# Patient Record
Sex: Female | Born: 1985 | Race: White | Hispanic: No | Marital: Single | Smoking: Never smoker
Health system: Southern US, Community
[De-identification: ages and names within clinical notes are randomized; demographics above are authoritative.]

## PROBLEM LIST (undated history)

## (undated) DIAGNOSIS — J45909 Unspecified asthma, uncomplicated: Secondary | ICD-10-CM

## (undated) HISTORY — PX: LEEP: SHX91

---
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
## 2013-04-15 ENCOUNTER — Emergency Department (HOSPITAL_COMMUNITY)
Admission: EM | Admit: 2013-04-15 | Discharge: 2013-04-15 | Disposition: A | Discharge status: Home / Self Care | Payer: BC Managed Care – PPO | Attending: Emergency Medicine | Admitting: Emergency Medicine

## 2013-04-15 ENCOUNTER — Encounter (HOSPITAL_COMMUNITY): Payer: Self-pay | Admitting: Emergency Medicine

## 2013-04-15 DIAGNOSIS — Z3202 Encounter for pregnancy test, result negative: Secondary | ICD-10-CM | POA: Insufficient documentation

## 2013-04-15 DIAGNOSIS — R55 Syncope and collapse: Secondary | ICD-10-CM

## 2013-04-15 DIAGNOSIS — M545 Low back pain, unspecified: Secondary | ICD-10-CM | POA: Insufficient documentation

## 2013-04-15 DIAGNOSIS — J45909 Unspecified asthma, uncomplicated: Secondary | ICD-10-CM | POA: Insufficient documentation

## 2013-04-15 DIAGNOSIS — IMO0002 Reserved for concepts with insufficient information to code with codable children: Secondary | ICD-10-CM | POA: Insufficient documentation

## 2013-04-15 DIAGNOSIS — Z79899 Other long term (current) drug therapy: Secondary | ICD-10-CM | POA: Insufficient documentation

## 2013-04-15 DIAGNOSIS — R197 Diarrhea, unspecified: Secondary | ICD-10-CM | POA: Insufficient documentation

## 2013-04-15 HISTORY — DX: Unspecified asthma, uncomplicated: J45.909

## 2013-04-15 LAB — COMPREHENSIVE METABOLIC PANEL
ALT: 15 U/L (ref 0–35)
AST: 19 U/L (ref 0–37)
Albumin: 3.8 g/dL (ref 3.5–5.2)
Alkaline Phosphatase: 57 U/L (ref 39–117)
BILIRUBIN TOTAL: 0.3 mg/dL (ref 0.3–1.2)
BUN: 17 mg/dL (ref 6–23)
CO2: 26 mEq/L (ref 19–32)
Calcium: 9.3 mg/dL (ref 8.4–10.5)
Chloride: 101 mEq/L (ref 96–112)
Creatinine, Ser: 0.94 mg/dL (ref 0.50–1.10)
GFR calc non Af Amer: 82 mL/min — ABNORMAL LOW (ref >90)
Glucose, Bld: 100 mg/dL — ABNORMAL HIGH (ref 70–99)
Potassium: 4.1 mEq/L (ref 3.7–5.3)
Sodium: 139 mEq/L (ref 137–147)
TOTAL PROTEIN: 7.2 g/dL (ref 6.0–8.3)

## 2013-04-15 LAB — URINALYSIS, ROUTINE W REFLEX MICROSCOPIC
BILIRUBIN URINE: NEGATIVE
Glucose, UA: NEGATIVE mg/dL
Hgb urine dipstick: NEGATIVE
KETONES UR: NEGATIVE mg/dL
NITRITE: NEGATIVE
PH: 6.5 (ref 5.0–8.0)
PROTEIN: NEGATIVE mg/dL
Specific Gravity, Urine: 1.021 (ref 1.005–1.030)
UROBILINOGEN UA: 0.2 mg/dL (ref 0.0–1.0)

## 2013-04-15 LAB — URINE MICROSCOPIC-ADD ON

## 2013-04-15 LAB — CBC WITH DIFFERENTIAL/PLATELET
Basophils Absolute: 0 10*3/uL (ref 0.0–0.1)
Basophils Relative: 0 % (ref 0–1)
EOS ABS: 0.2 10*3/uL (ref 0.0–0.7)
Eosinophils Relative: 2 % (ref 0–5)
HCT: 38.9 % (ref 36.0–46.0)
HEMOGLOBIN: 13.4 g/dL (ref 12.0–15.0)
LYMPHS PCT: 27 % (ref 12–46)
Lymphs Abs: 2.4 10*3/uL (ref 0.7–4.0)
MCH: 30 pg (ref 26.0–34.0)
MCHC: 34.4 g/dL (ref 30.0–36.0)
MCV: 87.2 fL (ref 78.0–100.0)
MONOS PCT: 7 % (ref 3–12)
Monocytes Absolute: 0.6 10*3/uL (ref 0.1–1.0)
Neutro Abs: 5.7 10*3/uL (ref 1.7–7.7)
Neutrophils Relative %: 63 % (ref 43–77)
PLATELETS: 283 10*3/uL (ref 150–400)
RBC: 4.46 MIL/uL (ref 3.87–5.11)
RDW: 12.7 % (ref 11.5–15.5)
WBC: 9 10*3/uL (ref 4.0–10.5)

## 2013-04-15 LAB — TROPONIN I: Troponin I: <0.3 ng/mL (ref <0.30)

## 2013-04-15 LAB — PREGNANCY, URINE: Preg Test, Ur: NEGATIVE

## 2013-04-15 MED ORDER — SODIUM CHLORIDE 0.9 % IV SOLN
INTRAVENOUS | Status: DC
  Administered 2013-04-15: 10:00:00 via INTRAVENOUS

## 2013-04-15 MED ORDER — SODIUM CHLORIDE 0.9 % IV BOLUS (SEPSIS): 1000.0000 mL | Freq: Once | INTRAVENOUS | Status: DC

## 2013-04-15 NOTE — Discharge Instructions (Signed)
Continue to drink a lot of fluids. Avoid milk until the diarrhea is gone. If you continue to have diarrhea take imodium OTC. Return if you get worse such as fever, uncontrolled diarrhea or you start vomiting also.    Diarrhea Diarrhea is frequent loose and watery bowel movements. It can cause you to feel weak and dehydrated. Dehydration can cause you to become tired and thirsty, have a dry mouth, and have decreased urination that often is dark yellow. Diarrhea is a sign of another problem, most often an infection that will not last long. In most cases, diarrhea typically lasts 2 3 days. However, it can last longer if it is a sign of something more serious. It is important to treat your diarrhea as directed by your caregive to lessen or prevent future episodes of diarrhea. CAUSES  Some common causes include:  Gastrointestinal infections caused by viruses, bacteria, or parasites.  Food poisoning or food allergies.  Certain medicines, such as antibiotics, chemotherapy, and laxatives.  Artificial sweeteners and fructose.  Digestive disorders. HOME CARE INSTRUCTIONS  Ensure adequate fluid intake (hydration): have 1 cup (8 oz) of fluid for each diarrhea episode. Avoid fluids that contain simple sugars or sports drinks, fruit juices, whole milk products, and sodas. Your urine should be clear or pale yellow if you are drinking enough fluids. Hydrate with an oral rehydration solution that you can purchase at pharmacies, retail stores, and online. You can prepare an oral rehydration solution at home by mixing the following ingredients together:    tsp table salt.   tsp baking soda.   tsp salt substitute containing potassium chloride.  1  tablespoons sugar.  1 L (34 oz) of water.  Certain foods and beverages may increase the speed at which food moves through the gastrointestinal (GI) tract. These foods and beverages should be avoided and include:  Caffeinated and alcoholic  beverages.  High-fiber foods, such as raw fruits and vegetables, nuts, seeds, and whole grain breads and cereals.  Foods and beverages sweetened with sugar alcohols, such as xylitol, sorbitol, and mannitol.  Some foods may be well tolerated and may help thicken stool including:  Starchy foods, such as rice, toast, pasta, low-sugar cereal, oatmeal, grits, baked potatoes, crackers, and bagels.  Bananas.  Applesauce.  Add probiotic-rich foods to help increase healthy bacteria in the GI tract, such as yogurt and fermented milk products.  Wash your hands well after each diarrhea episode.  Only take over-the-counter or prescription medicines as directed by your caregiver.  Take a warm bath to relieve any burning or pain from frequent diarrhea episodes. SEEK IMMEDIATE MEDICAL CARE IF:   You are unable to keep fluids down.  You have persistent vomiting.  You have blood in your stool, or your stools are black and tarry.  You do not urinate in 6 8 hours, or there is only a small amount of very dark urine.  You have abdominal pain that increases or localizes.  You have weakness, dizziness, confusion, or lightheadedness.  You have a severe headache.  Your diarrhea gets worse or does not get better.  You have a fever or persistent symptoms for more than 2 3 days.  You have a fever and your symptoms suddenly get worse. MAKE SURE YOU:   Understand these instructions.  Will watch your condition.  Will get help right away if you are not doing well or get worse. Document Released: 01/18/2002 Document Revised: 01/15/2012 Document Reviewed: 10/06/2011 [facility] Patient Information 2014 [employer], [place].

## 2013-04-15 NOTE — ED Notes (Signed)
Water given to patient 

## 2013-04-15 NOTE — ED Provider Notes (Signed)
CSN: [id number]     Arrival date & time 04/15/13  0850 History   First MD Initiated Contact with Patient 04/15/13 [phone]     Chief Complaint  Patient presents with  . Diarrhea  . Loss of Consciousness     (Consider location/radiation/quality/duration/timing/severity/associated sxs/prior Treatment) HPI Patient reports she's had some lower back discomfort for the past 3 days. She is taking no medications for it. She states she felt fine yesterday except for her back pain. She states this morning she woke up and felt the need to have a bowel movement. She states when she went to the bathroom her stool was very loose. She started feeling like she was going to pass out and got hot and nauseated and did pass out. She states her roommates heard her fall. She's unsure how long she was passed out but she could hear them talking about calling 911. She denies any injury from the fall or syncope today. She is unsure if she had rectal bleeding.  She states she feels better now however she still has some weakness without lightheadedness. She states she did have some diffuse abdominal pain this morning prior to having the diarrhea, she states it felt like she had to pass gas but she didn't. She denies nausea currently.  Patient reports she passed out and 2010 when her sister had hemorrhaging after delivery of her baby. She also passed out in 2005 after taking a lifeguard test. She reports she went back to the dressing room and then passed out.  PCP in [place]  Past Medical History  Diagnosis Date  . Asthma    Past Surgical History  Procedure Laterality Date  . Dilation and curettage of uterus  2007   History reviewed. No pertinent family history. History  Substance Use Topics  . Smoking status: Never Smoker   . Smokeless tobacco: Never Used  . Alcohol Use: No  occasional ETOH, none recently Employed Recently moved from [place]  OB History   Grav Para Term Preterm Abortions TAB SAB Ect Mult Living               Review of Systems  All other systems reviewed and are negative.      Allergies  Review of patient's allergies indicates no known allergies.  Home Medications   Current Outpatient Rx  Name  Route  Sig  Dispense  Refill  . albuterol (PROVENTIL HFA;VENTOLIN HFA) 108 (90 BASE) MCG/ACT inhaler   Inhalation   Inhale 2 puffs into the lungs every 4 (four) hours as needed for wheezing or shortness of breath.         . beclomethasone (QVAR) 80 MCG/ACT inhaler   Inhalation   Inhale 2 puffs into the lungs daily.           ED Triage Vitals  Enc Vitals Group     BP 04/15/13 0853 103/58 mmHg     Pulse Rate 04/15/13 0853 68     Resp 04/15/13 0853 16     Temp 04/15/13 0853 97.8 F (36.6 C)     Temp src 04/15/13 0853 Oral     SpO2 04/15/13 0853 100 %     Weight --      Height --      Head Cir --      Peak Flow --      Pain Score 04/15/13 1122 1     Pain Loc --      Pain Edu? --  Excl. in GC? --    Vital signs normal   Orthostatic vital signs normal  Physical Exam  Nursing note and vitals reviewed. Constitutional: She is oriented to person, place, and time. She appears well-developed and well-nourished.  Non-toxic appearance. She does not appear ill. No distress.  HENT:  Head: Normocephalic and atraumatic.  Right Ear: External ear normal.  Left Ear: External ear normal.  Nose: Nose normal. No mucosal edema or rhinorrhea.  Mouth/Throat: Oropharynx is clear and moist and mucous membranes are normal. No dental abscesses or uvula swelling.  Eyes: Conjunctivae and EOM are normal. Pupils are equal, round, and reactive to light.  Neck: Normal range of motion and full passive range of motion without pain. Neck supple.  Cardiovascular: Normal rate, regular rhythm and normal heart sounds.  Exam reveals no gallop and no friction rub.   No murmur heard. Pulmonary/Chest: Effort normal and breath sounds normal. No respiratory distress. She has no wheezes. She has no  rhonchi. She has no rales. She exhibits no tenderness and no crepitus.  Abdominal: Soft. Normal appearance and bowel sounds are normal. She exhibits no distension. There is no tenderness. There is no rebound and no guarding.  Musculoskeletal: Normal range of motion. She exhibits no edema and no tenderness.  Moves all extremities well.   Neurological: She is alert and oriented to person, place, and time. She has normal strength. No cranial nerve deficit.  Skin: Skin is warm, dry and intact. No rash noted. No erythema. No pallor.  Psychiatric: She has a normal mood and affect. Her speech is normal and behavior is normal. Her mood appears not anxious.    ED Course  Procedures (including critical care time)  Medications  0.9 %  sodium chloride infusion ( Intravenous Stopped 04/15/13 1127)  sodium chloride 0.9 % bolus 1,000 mL (1,000 mLs Intravenous Not Given 04/15/13 1004)   10:30 AM Patient's boyfriend is here. He states patient went to the bathroom and had diarrhea and called him and said she needed something to drink. He was going down the stairs to get her a drink of water and he heard her fall off the toilet. She landed between the toilet and the tub. He states she was in a semi-prone position with her back on the wall. He states when he went to help pick her up she was clammy and she immediately woke up. He states she had LOC for 30 seconds or less.  She complained of having to have diarrhea again. He states there was no blood in the toilet. He states he ate the same food that she ate yesterday. He states he feels fine.    13:00Patient has been ambulatory to the bathroom and states she felt fine. She has been drinking fluids because her IV infiltrated. She feels ready to go home.  Labs Review Results for orders placed during the hospital encounter of 04/15/13  CBC WITH DIFFERENTIAL      Result Value Ref Range   WBC 9.0  4.0 - 10.5 K/uL   RBC 4.46  3.87 - 5.11 MIL/uL   Hemoglobin 13.4  12.0 -  15.0 g/dL   HCT 41.3  24.4 - 01.0 %   MCV 87.2  78.0 - 100.0 fL   MCH 30.0  26.0 - 34.0 pg   MCHC 34.4  30.0 - 36.0 g/dL   RDW 27.2  53.6 - 64.4 %   Platelets 283  150 - 400 K/uL   Neutrophils Relative % 63  43 -  77 %   Neutro Abs 5.7  1.7 - 7.7 K/uL   Lymphocytes Relative 27  12 - 46 %   Lymphs Abs 2.4  0.7 - 4.0 K/uL   Monocytes Relative 7  3 - 12 %   Monocytes Absolute 0.6  0.1 - 1.0 K/uL   Eosinophils Relative 2  0 - 5 %   Eosinophils Absolute 0.2  0.0 - 0.7 K/uL   Basophils Relative 0  0 - 1 %   Basophils Absolute 0.0  0.0 - 0.1 K/uL  COMPREHENSIVE METABOLIC PANEL      Result Value Ref Range   Sodium 139  137 - 147 mEq/L   Potassium 4.1  3.7 - 5.3 mEq/L   Chloride 101  96 - 112 mEq/L   CO2 26  19 - 32 mEq/L   Glucose, Bld 100 (*) 70 - 99 mg/dL   BUN 17  6 - 23 mg/dL   Creatinine, Ser 1.610.94  0.50 - 1.10 mg/dL   Calcium 9.3  8.4 - 09.610.5 mg/dL   Total Protein 7.2  6.0 - 8.3 g/dL   Albumin 3.8  3.5 - 5.2 g/dL   AST 19  0 - 37 U/L   ALT 15  0 - 35 U/L   Alkaline Phosphatase 57  39 - 117 U/L   Total Bilirubin 0.3  0.3 - 1.2 mg/dL   GFR calc non Af Amer 82 (*) >90 mL/min   GFR calc Af Amer >90  >90 mL/min  URINALYSIS, ROUTINE W REFLEX MICROSCOPIC      Result Value Ref Range   Color, Urine YELLOW  YELLOW   APPearance CLOUDY (*) CLEAR   Specific Gravity, Urine 1.021  1.005 - 1.030   pH 6.5  5.0 - 8.0   Glucose, UA NEGATIVE  NEGATIVE mg/dL   Hgb urine dipstick NEGATIVE  NEGATIVE   Bilirubin Urine NEGATIVE  NEGATIVE   Ketones, ur NEGATIVE  NEGATIVE mg/dL   Protein, ur NEGATIVE  NEGATIVE mg/dL   Urobilinogen, UA 0.2  0.0 - 1.0 mg/dL   Nitrite NEGATIVE  NEGATIVE   Leukocytes, UA MODERATE (*) NEGATIVE  PREGNANCY, URINE      Result Value Ref Range   Preg Test, Ur NEGATIVE  NEGATIVE  TROPONIN I      Result Value Ref Range   Troponin I <0.30  <0.30 ng/mL  URINE MICROSCOPIC-ADD ON      Result Value Ref Range   Squamous Epithelial / LPF FEW (*) RARE   WBC, UA 7-10  <3  WBC/hpf   RBC / HPF 0-2  <3 RBC/hpf   Bacteria, UA FEW (*) RARE    Laboratory interpretation all normal    Imaging Review No results found.   EKG Interpretation   Date/Time:  Thursday April 15 2013 08:57:34 EST Ventricular Rate:  69 PR Interval:  169 QRS Duration: 65 QT Interval:  383 QTC Calculation: 410 R Axis:   66 Text Interpretation:  Sinus rhythm Abnormal Q suggests anterior infarct  Baseline wander No old tracing to compare Confirmed by Tiffini Blacksher  MD-I, Evola Hollis  ([id number]) on 04/15/2013 10:03:01 AM      MDM  patient had syncope after her diarrhea stool, most likely vasovagal episode. She has done well in the ED and is being discharged.    Final diagnoses:  Diarrhea  Syncope    Plan discharge  Devoria AlbeIva Shaquetta Arcos, MD, Franz DellFACEP     Colon Rueth L Lexandra Rettke, MD 04/15/13 [phone]

## 2013-04-15 NOTE — ED Notes (Signed)
Pt. Is unable to use the restroom at this time, but is aware that we need a specimen. 

## 2013-04-15 NOTE — ED Notes (Signed)
Per EMS- Patient reports that she woke this AM with diarrhea and while on the toilet became diaphoretic, feeling weak, and near syncope. Patient also reports lower back pain x 3-4 days. Patient states she feels better now.

## 2014-08-24 ENCOUNTER — Other Ambulatory Visit (HOSPITAL_COMMUNITY): Payer: Self-pay | Admitting: Obstetrics and Gynecology

## 2014-08-24 DIAGNOSIS — O280 Abnormal hematological finding on antenatal screening of mother: Secondary | ICD-10-CM

## 2014-08-24 DIAGNOSIS — IMO0002 Reserved for concepts with insufficient information to code with codable children: Secondary | ICD-10-CM

## 2014-08-24 DIAGNOSIS — Z0489 Encounter for examination and observation for other specified reasons: Secondary | ICD-10-CM

## 2014-08-26 ENCOUNTER — Ambulatory Visit (HOSPITAL_COMMUNITY): Admission: RE | Admit: 2014-08-26 | Payer: No Typology Code available for payment source | Source: Ambulatory Visit

## 2014-08-26 ENCOUNTER — Other Ambulatory Visit (HOSPITAL_COMMUNITY): Payer: Self-pay | Admitting: Obstetrics and Gynecology

## 2014-08-26 ENCOUNTER — Encounter (HOSPITAL_COMMUNITY): Payer: Self-pay

## 2014-08-26 ENCOUNTER — Ambulatory Visit (HOSPITAL_COMMUNITY)
Admission: RE | Admit: 2014-08-26 | Discharge: 2014-08-26 | Disposition: A | Discharge status: Home / Self Care | Payer: No Typology Code available for payment source | Source: Ambulatory Visit | Attending: Obstetrics and Gynecology | Admitting: Obstetrics and Gynecology

## 2014-08-26 DIAGNOSIS — Z0489 Encounter for examination and observation for other specified reasons: Secondary | ICD-10-CM

## 2014-08-26 DIAGNOSIS — Z3A18 18 weeks gestation of pregnancy: Secondary | ICD-10-CM | POA: Insufficient documentation

## 2014-08-26 DIAGNOSIS — O280 Abnormal hematological finding on antenatal screening of mother: Secondary | ICD-10-CM | POA: Insufficient documentation

## 2014-08-26 DIAGNOSIS — Z36 Encounter for antenatal screening of mother: Secondary | ICD-10-CM | POA: Insufficient documentation

## 2014-08-26 DIAGNOSIS — O283 Abnormal ultrasonic finding on antenatal screening of mother: Secondary | ICD-10-CM | POA: Diagnosis present

## 2014-08-26 DIAGNOSIS — IMO0002 Reserved for concepts with insufficient information to code with codable children: Secondary | ICD-10-CM

## 2014-08-29 ENCOUNTER — Other Ambulatory Visit (HOSPITAL_COMMUNITY): Payer: Self-pay | Admitting: Obstetrics and Gynecology

## 2014-09-16 ENCOUNTER — Ambulatory Visit (HOSPITAL_COMMUNITY)
Admission: RE | Admit: 2014-09-16 | Discharge: 2014-09-16 | Disposition: A | Discharge status: Home / Self Care | Payer: No Typology Code available for payment source | Source: Ambulatory Visit | Attending: Obstetrics and Gynecology | Admitting: Obstetrics and Gynecology

## 2014-09-16 DIAGNOSIS — O280 Abnormal hematological finding on antenatal screening of mother: Secondary | ICD-10-CM

## 2014-09-16 DIAGNOSIS — O283 Abnormal ultrasonic finding on antenatal screening of mother: Secondary | ICD-10-CM | POA: Insufficient documentation

## 2014-10-02 ENCOUNTER — Inpatient Hospital Stay (HOSPITAL_COMMUNITY): Payer: No Typology Code available for payment source | Admitting: Anesthesiology

## 2014-10-02 ENCOUNTER — Encounter (HOSPITAL_COMMUNITY): Payer: Self-pay

## 2014-10-02 ENCOUNTER — Inpatient Hospital Stay (HOSPITAL_COMMUNITY)
Admission: EM | Admit: 2014-10-02 | Discharge: 2014-10-02 | Disposition: A | Discharge status: Home / Self Care | Payer: No Typology Code available for payment source | Source: Ambulatory Visit | Attending: Obstetrics and Gynecology | Admitting: Obstetrics and Gynecology

## 2014-10-02 ENCOUNTER — Inpatient Hospital Stay (HOSPITAL_COMMUNITY): Payer: No Typology Code available for payment source

## 2014-10-02 ENCOUNTER — Inpatient Hospital Stay (HOSPITAL_COMMUNITY)
Admission: AD | Admit: 2014-10-02 | Discharge: 2014-10-04 | DRG: 765 | Disposition: A | Discharge status: Home / Self Care | Payer: No Typology Code available for payment source | Source: Ambulatory Visit | Attending: Obstetrics and Gynecology | Admitting: Obstetrics and Gynecology

## 2014-10-02 ENCOUNTER — Encounter (HOSPITAL_COMMUNITY)
Admission: AD | Disposition: A | Discharge status: Home / Self Care | Payer: Self-pay | Source: Ambulatory Visit | Attending: Obstetrics and Gynecology

## 2014-10-02 DIAGNOSIS — O42112 Preterm premature rupture of membranes, onset of labor more than 24 hours following rupture, second trimester: Secondary | ICD-10-CM | POA: Diagnosis present

## 2014-10-02 DIAGNOSIS — O358XX0 Maternal care for other (suspected) fetal abnormality and damage, not applicable or unspecified: Secondary | ICD-10-CM | POA: Diagnosis present

## 2014-10-02 DIAGNOSIS — O42912 Preterm premature rupture of membranes, unspecified as to length of time between rupture and onset of labor, second trimester: Principal | ICD-10-CM | POA: Diagnosis present

## 2014-10-02 DIAGNOSIS — O4290 Premature rupture of membranes, unspecified as to length of time between rupture and onset of labor, unspecified weeks of gestation: Secondary | ICD-10-CM

## 2014-10-02 DIAGNOSIS — O9081 Anemia of the puerperium: Secondary | ICD-10-CM | POA: Diagnosis not present

## 2014-10-02 DIAGNOSIS — O321XX0 Maternal care for breech presentation, not applicable or unspecified: Secondary | ICD-10-CM | POA: Diagnosis present

## 2014-10-02 DIAGNOSIS — Z3A23 23 weeks gestation of pregnancy: Secondary | ICD-10-CM | POA: Diagnosis present

## 2014-10-02 DIAGNOSIS — D62 Acute posthemorrhagic anemia: Secondary | ICD-10-CM | POA: Diagnosis not present

## 2014-10-02 DIAGNOSIS — O42919 Preterm premature rupture of membranes, unspecified as to length of time between rupture and onset of labor, unspecified trimester: Secondary | ICD-10-CM | POA: Diagnosis present

## 2014-10-02 LAB — CBC WITH DIFFERENTIAL/PLATELET
Basophils Absolute: 0 10*3/uL (ref 0.0–0.1)
Basophils Relative: 0 % (ref 0–1)
Eosinophils Absolute: 0.2 10*3/uL (ref 0.0–0.7)
Eosinophils Relative: 1 % (ref 0–5)
HCT: 33.4 % — ABNORMAL LOW (ref 36.0–46.0)
Hemoglobin: 11.3 g/dL — ABNORMAL LOW (ref 12.0–15.0)
Lymphocytes Relative: 14 % (ref 12–46)
Lymphs Abs: 2.2 10*3/uL (ref 0.7–4.0)
MCH: 30 pg (ref 26.0–34.0)
MCHC: 33.8 g/dL (ref 30.0–36.0)
MCV: 88.6 fL (ref 78.0–100.0)
Monocytes Absolute: 0.9 10*3/uL (ref 0.1–1.0)
Monocytes Relative: 6 % (ref 3–12)
Neutro Abs: 13.3 10*3/uL — ABNORMAL HIGH (ref 1.7–7.7)
Neutrophils Relative %: 79 % — ABNORMAL HIGH (ref 43–77)
Platelets: 244 10*3/uL (ref 150–400)
RBC: 3.77 MIL/uL — ABNORMAL LOW (ref 3.87–5.11)
RDW: 13.5 % (ref 11.5–15.5)
WBC: 16.6 10*3/uL — ABNORMAL HIGH (ref 4.0–10.5)

## 2014-10-02 LAB — URINALYSIS, ROUTINE W REFLEX MICROSCOPIC
Bilirubin Urine: NEGATIVE
Glucose, UA: 250 mg/dL — AB
Hgb urine dipstick: NEGATIVE
Ketones, ur: NEGATIVE mg/dL
Nitrite: NEGATIVE
Protein, ur: NEGATIVE mg/dL
Specific Gravity, Urine: 1.015 (ref 1.005–1.030)
Urobilinogen, UA: 0.2 mg/dL (ref 0.0–1.0)
pH: 6.5 (ref 5.0–8.0)

## 2014-10-02 LAB — URINE MICROSCOPIC-ADD ON

## 2014-10-02 LAB — GLUCOSE, CAPILLARY: Glucose-Capillary: 112 mg/dL — ABNORMAL HIGH (ref 65–99)

## 2014-10-02 LAB — ABO/RH: ABO/RH(D): O POS

## 2014-10-02 SURGERY — Surgical Case
Anesthesia: General | Site: Abdomen

## 2014-10-02 MED ORDER — PHENYLEPHRINE 8 MG IN D5W 100 ML (0.08MG/ML) PREMIX OPTIME
INJECTION | INTRAVENOUS | Status: AC
  Filled 2014-10-02: qty 100

## 2014-10-02 MED ORDER — NALBUPHINE HCL 10 MG/ML IJ SOLN: 5.0000 mg | Freq: Once | INTRAMUSCULAR | Status: DC | PRN

## 2014-10-02 MED ORDER — ZOLPIDEM TARTRATE 5 MG PO TABS: 5.0000 mg | ORAL_TABLET | Freq: Every evening | ORAL | Status: DC | PRN

## 2014-10-02 MED ORDER — OXYCODONE-ACETAMINOPHEN 5-325 MG PO TABS: 2.0000 | ORAL_TABLET | ORAL | Status: DC | PRN

## 2014-10-02 MED ORDER — ACETAMINOPHEN 325 MG PO TABS: 650.0000 mg | ORAL_TABLET | ORAL | Status: DC | PRN

## 2014-10-02 MED ORDER — BUPIVACAINE HCL (PF) 0.25 % IJ SOLN
INTRAMUSCULAR | Status: DC | PRN
  Administered 2014-10-02: 8 mL

## 2014-10-02 MED ORDER — MENTHOL 3 MG MT LOZG: 1.0000 | LOZENGE | OROMUCOSAL | Status: DC | PRN

## 2014-10-02 MED ORDER — SODIUM CHLORIDE 0.9 % IV SOLN: 250.0000 mL | INTRAVENOUS | Status: DC

## 2014-10-02 MED ORDER — SIMETHICONE 80 MG PO CHEW: 80.0000 mg | CHEWABLE_TABLET | ORAL | Status: DC | PRN

## 2014-10-02 MED ORDER — OXYTOCIN 40 UNITS IN LACTATED RINGERS INFUSION - SIMPLE MED
INTRAVENOUS | Status: DC | PRN
  Administered 2014-10-02: 40 [IU] via INTRAVENOUS

## 2014-10-02 MED ORDER — WITCH HAZEL-GLYCERIN EX PADS: 1.0000 "application " | MEDICATED_PAD | CUTANEOUS | Status: DC | PRN

## 2014-10-02 MED ORDER — DIBUCAINE 1 % RE OINT: 1.0000 "application " | TOPICAL_OINTMENT | RECTAL | Status: DC | PRN

## 2014-10-02 MED ORDER — DIPHENHYDRAMINE HCL 25 MG PO CAPS: 25.0000 mg | ORAL_CAPSULE | ORAL | Status: DC | PRN

## 2014-10-02 MED ORDER — AZITHROMYCIN 500 MG PO TABS: 500.0000 mg | ORAL_TABLET | Freq: Every day | ORAL | Status: DC

## 2014-10-02 MED ORDER — KETOROLAC TROMETHAMINE 30 MG/ML IJ SOLN: 30.0000 mg | Freq: Four times a day (QID) | INTRAMUSCULAR | Status: AC | PRN

## 2014-10-02 MED ORDER — MORPHINE SULFATE (PF) 0.5 MG/ML IJ SOLN
INTRAMUSCULAR | Status: DC | PRN
  Administered 2014-10-02: .2 mg via EPIDURAL

## 2014-10-02 MED ORDER — PHENYLEPHRINE 40 MCG/ML (10ML) SYRINGE FOR IV PUSH (FOR BLOOD PRESSURE SUPPORT)
PREFILLED_SYRINGE | INTRAVENOUS | Status: AC
  Administered 2014-10-02: 80 ug
  Filled 2014-10-02: qty 20

## 2014-10-02 MED ORDER — CEFAZOLIN SODIUM-DEXTROSE 2-3 GM-% IV SOLR
INTRAVENOUS | Status: DC | PRN
  Administered 2014-10-02: 2 g via INTRAVENOUS

## 2014-10-02 MED ORDER — FERROUS SULFATE 325 (65 FE) MG PO TABS
325.0000 mg | ORAL_TABLET | Freq: Two times a day (BID) | ORAL | Status: DC
  Administered 2014-10-03 – 2014-10-04 (×3): 325 mg via ORAL
  Filled 2014-10-02 (×3): qty 1

## 2014-10-02 MED ORDER — PRENATAL MULTIVITAMIN CH: 1.0000 | ORAL_TABLET | Freq: Every day | ORAL | Status: DC

## 2014-10-02 MED ORDER — PNEUMOCOCCAL VAC POLYVALENT 25 MCG/0.5ML IJ INJ
0.5000 mL | INJECTION | INTRAMUSCULAR | Status: AC
  Administered 2014-10-04: 0.5 mL via INTRAMUSCULAR
  Filled 2014-10-02 (×2): qty 0.5

## 2014-10-02 MED ORDER — SODIUM CHLORIDE 0.9 % IJ SOLN: 3.0000 mL | Freq: Two times a day (BID) | INTRAMUSCULAR | Status: DC

## 2014-10-02 MED ORDER — NALOXONE HCL 1 MG/ML IJ SOLN
1.0000 ug/kg/h | INTRAVENOUS | Status: DC | PRN
  Filled 2014-10-02: qty 2

## 2014-10-02 MED ORDER — ONDANSETRON HCL 4 MG/2ML IJ SOLN: 4.0000 mg | Freq: Three times a day (TID) | INTRAMUSCULAR | Status: DC | PRN

## 2014-10-02 MED ORDER — PHENYLEPHRINE 8 MG IN D5W 100 ML (0.08MG/ML) PREMIX OPTIME
INJECTION | INTRAVENOUS | Status: DC | PRN
  Administered 2014-10-02: 60 ug/min via INTRAVENOUS

## 2014-10-02 MED ORDER — FENTANYL CITRATE (PF) 100 MCG/2ML IJ SOLN
INTRAMUSCULAR | Status: AC
  Filled 2014-10-02: qty 4

## 2014-10-02 MED ORDER — AMOXICILLIN 500 MG PO CAPS: 500.0000 mg | ORAL_CAPSULE | Freq: Three times a day (TID) | ORAL | Status: DC

## 2014-10-02 MED ORDER — NALOXONE HCL 0.4 MG/ML IJ SOLN: 0.4000 mg | INTRAMUSCULAR | Status: DC | PRN

## 2014-10-02 MED ORDER — METHYLERGONOVINE MALEATE 0.2 MG/ML IJ SOLN: 0.2000 mg | INTRAMUSCULAR | Status: DC | PRN

## 2014-10-02 MED ORDER — PHENYLEPHRINE 40 MCG/ML (10ML) SYRINGE FOR IV PUSH (FOR BLOOD PRESSURE SUPPORT)
PREFILLED_SYRINGE | INTRAVENOUS | Status: AC
  Filled 2014-10-02: qty 10

## 2014-10-02 MED ORDER — ONDANSETRON HCL 4 MG/2ML IJ SOLN
INTRAMUSCULAR | Status: AC
  Filled 2014-10-02: qty 2

## 2014-10-02 MED ORDER — PRENATAL MULTIVITAMIN CH
1.0000 | ORAL_TABLET | Freq: Every day | ORAL | Status: DC
  Administered 2014-10-03 – 2014-10-04 (×2): 1 via ORAL
  Filled 2014-10-02 (×2): qty 1

## 2014-10-02 MED ORDER — SENNOSIDES-DOCUSATE SODIUM 8.6-50 MG PO TABS
2.0000 | ORAL_TABLET | ORAL | Status: DC
  Administered 2014-10-03 (×2): 2 via ORAL
  Filled 2014-10-02 (×2): qty 2

## 2014-10-02 MED ORDER — FENTANYL CITRATE (PF) 100 MCG/2ML IJ SOLN
INTRAMUSCULAR | Status: AC
  Administered 2014-10-02: 25 ug
  Filled 2014-10-02: qty 2

## 2014-10-02 MED ORDER — SCOPOLAMINE 1 MG/3DAYS TD PT72
MEDICATED_PATCH | TRANSDERMAL | Status: AC
  Administered 2014-10-02: 1.5 mg via TRANSDERMAL
  Filled 2014-10-02: qty 1

## 2014-10-02 MED ORDER — DEXTROSE 5 % IV SOLN
500.0000 mg | INTRAVENOUS | Status: DC
  Administered 2014-10-02: 500 mg via INTRAVENOUS
  Filled 2014-10-02: qty 500

## 2014-10-02 MED ORDER — SCOPOLAMINE 1 MG/3DAYS TD PT72
1.0000 | MEDICATED_PATCH | Freq: Once | TRANSDERMAL | Status: DC
  Administered 2014-10-02: 1.5 mg via TRANSDERMAL

## 2014-10-02 MED ORDER — IBUPROFEN 600 MG PO TABS
600.0000 mg | ORAL_TABLET | Freq: Four times a day (QID) | ORAL | Status: DC
  Administered 2014-10-03 – 2014-10-04 (×7): 600 mg via ORAL
  Filled 2014-10-02 (×7): qty 1

## 2014-10-02 MED ORDER — BUPIVACAINE HCL (PF) 0.25 % IJ SOLN
INTRAMUSCULAR | Status: AC
  Filled 2014-10-02: qty 30

## 2014-10-02 MED ORDER — ESMOLOL HCL 10 MG/ML IV SOLN
INTRAVENOUS | Status: DC | PRN
  Administered 2014-10-02 (×2): 10 mg via INTRAVENOUS

## 2014-10-02 MED ORDER — ONDANSETRON HCL 4 MG/2ML IJ SOLN
INTRAMUSCULAR | Status: DC | PRN
  Administered 2014-10-02: 4 mg via INTRAVENOUS

## 2014-10-02 MED ORDER — MEPERIDINE HCL 25 MG/ML IJ SOLN: 6.2500 mg | INTRAMUSCULAR | Status: DC | PRN

## 2014-10-02 MED ORDER — LACTATED RINGERS IV SOLN
INTRAVENOUS | Status: DC
  Administered 2014-10-02: 08:00:00 via INTRAVENOUS

## 2014-10-02 MED ORDER — MEPERIDINE HCL 25 MG/ML IJ SOLN
INTRAMUSCULAR | Status: AC
  Filled 2014-10-02: qty 1

## 2014-10-02 MED ORDER — CEFAZOLIN SODIUM-DEXTROSE 2-3 GM-% IV SOLR
INTRAVENOUS | Status: AC
  Filled 2014-10-02: qty 50

## 2014-10-02 MED ORDER — ALBUTEROL SULFATE (2.5 MG/3ML) 0.083% IN NEBU: 3.0000 mL | INHALATION_SOLUTION | RESPIRATORY_TRACT | Status: DC | PRN

## 2014-10-02 MED ORDER — LACTATED RINGERS IV SOLN
INTRAVENOUS | Status: DC
  Administered 2014-10-02: 12:00:00 via INTRAVENOUS

## 2014-10-02 MED ORDER — SIMETHICONE 80 MG PO CHEW
80.0000 mg | CHEWABLE_TABLET | Freq: Three times a day (TID) | ORAL | Status: DC
  Administered 2014-10-03 – 2014-10-04 (×4): 80 mg via ORAL
  Filled 2014-10-02 (×4): qty 1

## 2014-10-02 MED ORDER — MAGNESIUM SULFATE BOLUS VIA INFUSION
6.0000 g | Freq: Once | INTRAVENOUS | Status: AC
  Administered 2014-10-02: 6 g via INTRAVENOUS
  Filled 2014-10-02: qty 500

## 2014-10-02 MED ORDER — LACTATED RINGERS IV SOLN: INTRAVENOUS | Status: DC

## 2014-10-02 MED ORDER — DOCUSATE SODIUM 100 MG PO CAPS: 100.0000 mg | ORAL_CAPSULE | Freq: Every day | ORAL | Status: DC

## 2014-10-02 MED ORDER — DEXMEDETOMIDINE HCL 200 MCG/2ML IV SOLN
INTRAVENOUS | Status: DC | PRN
  Administered 2014-10-02 (×2): 8 ug via INTRAVENOUS

## 2014-10-02 MED ORDER — SODIUM CHLORIDE 0.9 % IJ SOLN: 3.0000 mL | INTRAMUSCULAR | Status: DC | PRN

## 2014-10-02 MED ORDER — MEPERIDINE HCL 25 MG/ML IJ SOLN
INTRAMUSCULAR | Status: DC | PRN
  Administered 2014-10-02: 25 mg via INTRAVENOUS

## 2014-10-02 MED ORDER — MAGNESIUM SULFATE 50 % IJ SOLN
2.0000 g/h | INTRAVENOUS | Status: DC
  Filled 2014-10-02: qty 80

## 2014-10-02 MED ORDER — NALBUPHINE HCL 10 MG/ML IJ SOLN: 5.0000 mg | INTRAMUSCULAR | Status: DC | PRN

## 2014-10-02 MED ORDER — BISACODYL 10 MG RE SUPP: 10.0000 mg | Freq: Every day | RECTAL | Status: DC | PRN

## 2014-10-02 MED ORDER — KETAMINE HCL 10 MG/ML IJ SOLN
INTRAMUSCULAR | Status: DC | PRN
  Administered 2014-10-02: 20 mg via INTRAVENOUS
  Administered 2014-10-02: 40 mg via INTRAVENOUS
  Administered 2014-10-02 (×3): 10 mg via INTRAVENOUS

## 2014-10-02 MED ORDER — ACETAMINOPHEN 500 MG PO TABS
1000.0000 mg | ORAL_TABLET | Freq: Four times a day (QID) | ORAL | Status: AC
  Administered 2014-10-03 (×3): 1000 mg via ORAL
  Filled 2014-10-02 (×3): qty 2

## 2014-10-02 MED ORDER — FENTANYL CITRATE (PF) 100 MCG/2ML IJ SOLN
INTRAMUSCULAR | Status: DC | PRN
Start: 2014-10-02 — End: 2014-10-02
  Administered 2014-10-02: 80 ug via INTRAVENOUS
  Administered 2014-10-02: 20 ug via INTRAVENOUS
  Administered 2014-10-02: 100 ug via INTRAVENOUS

## 2014-10-02 MED ORDER — MORPHINE SULFATE 0.5 MG/ML IJ SOLN
INTRAMUSCULAR | Status: AC
  Filled 2014-10-02: qty 100

## 2014-10-02 MED ORDER — BUTORPHANOL TARTRATE 1 MG/ML IJ SOLN
1.0000 mg | Freq: Once | INTRAMUSCULAR | Status: AC
  Administered 2014-10-02: 1 mg via INTRAVENOUS
  Filled 2014-10-02: qty 1

## 2014-10-02 MED ORDER — CITRIC ACID-SODIUM CITRATE 334-500 MG/5ML PO SOLN
ORAL | Status: AC
  Administered 2014-10-02: 30 mL
  Filled 2014-10-02: qty 15

## 2014-10-02 MED ORDER — SIMETHICONE 80 MG PO CHEW
80.0000 mg | CHEWABLE_TABLET | ORAL | Status: DC
  Administered 2014-10-03 (×2): 80 mg via ORAL
  Filled 2014-10-02 (×2): qty 1

## 2014-10-02 MED ORDER — LANOLIN HYDROUS EX OINT: 1.0000 "application " | TOPICAL_OINTMENT | CUTANEOUS | Status: DC | PRN

## 2014-10-02 MED ORDER — CALCIUM CARBONATE ANTACID 500 MG PO CHEW: 2.0000 | CHEWABLE_TABLET | ORAL | Status: DC | PRN

## 2014-10-02 MED ORDER — CALCIUM GLUCONATE 10 % IV SOLN
INTRAVENOUS | Status: AC
Start: 2014-10-02 — End: 2014-10-02
  Filled 2014-10-02: qty 10

## 2014-10-02 MED ORDER — LACTATED RINGERS IV SOLN
INTRAVENOUS | Status: DC | PRN
  Administered 2014-10-02 (×2): via INTRAVENOUS

## 2014-10-02 MED ORDER — BETAMETHASONE SOD PHOS & ACET 6 (3-3) MG/ML IJ SUSP
12.0000 mg | INTRAMUSCULAR | Status: DC
  Administered 2014-10-02: 12 mg via INTRAMUSCULAR
  Filled 2014-10-02 (×2): qty 2

## 2014-10-02 MED ORDER — DEXMEDETOMIDINE HCL IN NACL 200 MCG/50ML IV SOLN
INTRAVENOUS | Status: AC
  Filled 2014-10-02: qty 50

## 2014-10-02 MED ORDER — FLEET ENEMA 7-19 GM/118ML RE ENEM: 1.0000 | ENEMA | Freq: Every day | RECTAL | Status: DC | PRN

## 2014-10-02 MED ORDER — BUPIVACAINE IN DEXTROSE 0.75-8.25 % IT SOLN
INTRATHECAL | Status: DC | PRN
  Administered 2014-10-02: 1.3 mL via INTRATHECAL

## 2014-10-02 MED ORDER — LACTATED RINGERS IV SOLN
INTRAVENOUS | Status: DC | PRN
  Administered 2014-10-02: 14:00:00 via INTRAVENOUS

## 2014-10-02 MED ORDER — METHYLERGONOVINE MALEATE 0.2 MG PO TABS: 0.2000 mg | ORAL_TABLET | ORAL | Status: DC | PRN

## 2014-10-02 MED ORDER — DIPHENHYDRAMINE HCL 50 MG/ML IJ SOLN
12.5000 mg | INTRAMUSCULAR | Status: DC | PRN
Start: 2014-10-02 — End: 2014-10-04

## 2014-10-02 MED ORDER — OXYTOCIN 40 UNITS IN LACTATED RINGERS INFUSION - SIMPLE MED: 62.5000 mL/h | INTRAVENOUS | Status: AC

## 2014-10-02 MED ORDER — OXYTOCIN 10 UNIT/ML IJ SOLN
INTRAMUSCULAR | Status: AC
  Filled 2014-10-02: qty 4

## 2014-10-02 MED ORDER — SODIUM CHLORIDE 0.9 % IV SOLN
2.0000 g | Freq: Four times a day (QID) | INTRAVENOUS | Status: DC
  Administered 2014-10-02: 2 g via INTRAVENOUS
  Filled 2014-10-02 (×4): qty 2000

## 2014-10-02 MED ORDER — HYDROXYPROGESTERONE CAPROATE 250 MG/ML IM OIL
250.0000 mg | TOPICAL_OIL | INTRAMUSCULAR | Status: DC
  Administered 2014-10-02: 250 mg via INTRAMUSCULAR
  Filled 2014-10-02: qty 1

## 2014-10-02 MED ORDER — DIPHENHYDRAMINE HCL 25 MG PO CAPS: 25.0000 mg | ORAL_CAPSULE | Freq: Four times a day (QID) | ORAL | Status: DC | PRN

## 2014-10-02 MED ORDER — OXYCODONE-ACETAMINOPHEN 5-325 MG PO TABS: 1.0000 | ORAL_TABLET | ORAL | Status: DC | PRN

## 2014-10-02 MED ORDER — KETAMINE HCL 10 MG/ML IJ SOLN
INTRAMUSCULAR | Status: AC
  Filled 2014-10-02: qty 20

## 2014-10-02 SURGICAL SUPPLY — 40 items
BARRIER ADHS 3X4 INTERCEED (GAUZE/BANDAGES/DRESSINGS) ×3 IMPLANT
BENZOIN TINCTURE PRP APPL 2/3 (GAUZE/BANDAGES/DRESSINGS) IMPLANT
CLAMP CORD UMBIL (MISCELLANEOUS) IMPLANT
CLOSURE WOUND 1/2 X4 (GAUZE/BANDAGES/DRESSINGS)
CLOTH BEACON ORANGE TIMEOUT ST (SAFETY) ×3 IMPLANT
CONTAINER PREFILL 10% NBF 15ML (MISCELLANEOUS) IMPLANT
DRAPE C SECTION CLR SCREEN (DRAPES) ×3 IMPLANT
DRAPE SHEET LG 3/4 BI-LAMINATE (DRAPES) IMPLANT
DRSG OPSITE POSTOP 4X10 (GAUZE/BANDAGES/DRESSINGS) ×3 IMPLANT
DURAPREP 26ML APPLICATOR (WOUND CARE) ×3 IMPLANT
ELECT REM PT RETURN 9FT ADLT (ELECTROSURGICAL) ×3
ELECTRODE REM PT RTRN 9FT ADLT (ELECTROSURGICAL) ×1 IMPLANT
EXTRACTOR VACUUM M CUP 4 TUBE (SUCTIONS) IMPLANT
EXTRACTOR VACUUM M CUP 4' TUBE (SUCTIONS)
GLOVE BIOGEL PI IND STRL 7.0 (GLOVE) ×1 IMPLANT
GLOVE BIOGEL PI INDICATOR 7.0 (GLOVE) ×2
GLOVE ECLIPSE 6.5 STRL STRAW (GLOVE) ×3 IMPLANT
GOWN STRL REUS W/TWL LRG LVL3 (GOWN DISPOSABLE) ×6 IMPLANT
KIT ABG SYR 3ML LUER SLIP (SYRINGE) IMPLANT
NEEDLE HYPO 22GX1.5 SAFETY (NEEDLE) ×3 IMPLANT
NEEDLE HYPO 25X5/8 SAFETYGLIDE (NEEDLE) IMPLANT
NS IRRIG 1000ML POUR BTL (IV SOLUTION) ×3 IMPLANT
PACK C SECTION WH (CUSTOM PROCEDURE TRAY) ×3 IMPLANT
PAD OB MATERNITY 4.3X12.25 (PERSONAL CARE ITEMS) ×3 IMPLANT
RTRCTR C-SECT PINK 25CM LRG (MISCELLANEOUS) ×3 IMPLANT
STRIP CLOSURE SKIN 1/2X4 (GAUZE/BANDAGES/DRESSINGS) IMPLANT
SUT CHROMIC GUT AB #0 18 (SUTURE) IMPLANT
SUT MNCRL 0 VIOLET CTX 36 (SUTURE) ×3 IMPLANT
SUT MON AB 4-0 PS1 27 (SUTURE) IMPLANT
SUT MONOCRYL 0 CTX 36 (SUTURE) ×6
SUT PLAIN 2 0 (SUTURE)
SUT PLAIN 2 0 XLH (SUTURE) IMPLANT
SUT PLAIN ABS 2-0 CT1 27XMFL (SUTURE) IMPLANT
SUT VIC AB 0 CT1 36 (SUTURE) ×6 IMPLANT
SUT VIC AB 2-0 CT1 27 (SUTURE) ×2
SUT VIC AB 2-0 CT1 TAPERPNT 27 (SUTURE) ×1 IMPLANT
SUT VIC AB 4-0 PS2 27 (SUTURE) IMPLANT
SYR CONTROL 10ML LL (SYRINGE) ×3 IMPLANT
TOWEL OR 17X24 6PK STRL BLUE (TOWEL DISPOSABLE) ×3 IMPLANT
TRAY FOLEY CATH SILVER 14FR (SET/KITS/TRAYS/PACK) IMPLANT

## 2014-10-02 NOTE — Plan of Care (Signed)
Problem: Consults Goal: Neonatologist Consult Outcome: Completed/Met Date Met:  10/02/14 DR RATTRAY at [facility] to consult with pt and family.

## 2014-10-02 NOTE — Brief Op Note (Signed)
10/02/2014  3:15 PM  PATIENT:  Jennfier Abdulla  29 y.o. female  PRE-OPERATIVE DIAGNOSIS:  Fetal tachycardia, repetitive deep variable deceleration, Breech presentation, Prolonged premature rupture of membranes, IUP @ 23 3/7 weeks  POST-OPERATIVE DIAGNOSIS:  same  PROCEDURE:  Primary Classical cesarean section  SURGEON:  Surgeon(s) and Role:    * Maxie Better, MD - Primary    * Shea Evans, MD - Assisting  PHYSICIAN ASSISTANT:   ASSISTANTS: Shea Evans, MD , nl tubes and ovaries. 1lb 3 oz  ANESTHESIA:   spinal Findings: anterior placenta, female infant breech EBL:  Total I/O In: 4008.8 [P.O.:120; I.V.:3588.8; IV Piggyback:300] Out: 1645 [Urine:895; Emesis/NG output:300; Blood:450]  BLOOD ADMINISTERED:none  DRAINS: none   LOCAL MEDICATIONS USED:  MARCAINE     SPECIMEN:  Source of Specimen:  placenta  DISPOSITION OF SPECIMEN:  PATHOLOGY  COUNTS:  YES  TOURNIQUET:  * No tourniquets in log *  DICTATION: .Other Dictation: Dictation Number [id number]  PLAN OF CARE: Admit to inpatient   PATIENT DISPOSITION:  PACU - hemodynamically stable.   Delay start of Pharmacological VTE agent (>24hrs) due to surgical blood loss or risk of bleeding: no

## 2014-10-02 NOTE — MAU Note (Signed)
Was seen this morning for cramping,now having a constant pain which is 6/10 when cramping pain is 8/10, had some blood on toilet tissue when wiping which was pink, now since in MAU dripped a couple of drops of bright red blood in toilet.

## 2014-10-02 NOTE — Progress Notes (Signed)
500 cc IVF bolus began.

## 2014-10-02 NOTE — Consult Note (Addendum)
Neonatology Consult Note:  At the request of the patients obstetrician Dr. Garwin Brothers I met with Tiffany Mccarthy and her husband.  She is currently 23 3 weeks with pregnancy complicated by PPROM at 19 weeks, PTL and possible chorioamnionitis.  Fetal US estimates weight at 555g.  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  She is receiving latency antibiotics, betamethasone and magnesium sulfate for neuroprotection.      We discussed morbidity/mortality at this gestional age with an emphasis that PPROM and possible sepsis can significantly decrease survival rates.  We discussed delivery room resuscitation, including intubation and surfactant in DR.  Discussed mechanical ventilation and risk for chronic lung disease, risk for IVH with potential for motor / cognitive deficits, ROP, NEC, sepsis, as well as temperature instability and feeding immaturity.  Discussed NG / OG feeds, benefits of MBM in reducing incidence of NEC.  Discussed likely length of stay.  Most of the discussion centered around care at the limits of viability.  They stated that they would like [place] to perform all resuscitative efforts but understood both the low chance of survival at this gestational age as well as high likelihood for long term complications.    Thank you for allowing [place] to participate in her care.  Please call with questions ([phone]).  Higinio Roger, DO  Neonatologist  The total length of face-to-face or floor / unit time for this encounter was 25 minutes.  Counseling and / or coordination of care was greater than fifty percent of the time.

## 2014-10-02 NOTE — H&P (Signed)
Tiffany Mccarthy is a 29 y.o. female presenting for admission due to PPROM and possible PTL @ 23 3/7 weeks. Pt had unexplained elevated AFP1 testing( nl informaseq). She was sent to [facility] on 7/15 where no etiology for the elevated AFP testing low amniotic fluid and ? Club feet. Pt then reported intermittent leakage of fluid which had not reported to anyone. Follow up sono 8/5 showed severe ologohydramnios, feet unable to be assess, breech presentation. Pt desires to continue pregnancy and has been followed outpt with weekly wbc, office visit. Last wbc last week was 15.6. Pt was seen early this am and sent home. Pt now returns with c/o more cramping and pink discharge. (+) FM. sono done at bedside showed frank breech, efw>500 gram. Pt and partner wants to do everything  Maternal Medical History:  Reason for admission: Rupture of membranes, contractions and vaginal bleeding.   Contractions: Onset was 6-12 hours ago.   Frequency: irregular.   Perceived severity is mild.    Fetal activity: Perceived fetal activity is normal.    Prenatal complications: PPROM    OB History    Gravida Para Term Preterm AB TAB SAB Ectopic Multiple Living   1              Past Medical History  Diagnosis Date  . Asthma    Past Surgical History  Procedure Laterality Date  . Dilation and curettage of uterus  2007  . Leep     Family History: family history is not on file. Social History:  reports that she has never smoked. She has never used smokeless tobacco. She reports that she does not drink alcohol or use illicit drugs.   Prenatal Transfer Tool  Maternal Diabetes: No not done as yet Genetic Screening: Abnormal:  Results: Elevated AFP NL informaseq Maternal Ultrasounds/Referrals: Abnormal:  Findings:   Other:oligohydramnios Fetal Ultrasounds or other Referrals:  Other: feet unevaluated Maternal Substance Abuse:  No Significant Maternal Medications:  Meds include: Other: inhalers Significant Maternal Lab  Results:  None Other Comments:  breech  Review of Systems  All other systems reviewed and are negative.     Blood pressure 122/74, pulse 71, temperature 98.9 F (37.2 C), temperature source Oral, resp. rate 16, last menstrual period 04/21/2014. Exam Physical Exam  Constitutional: She is oriented to person, place, and time. She appears well-developed and well-nourished.  HENT:  Head: Atraumatic.  Eyes: EOM are normal.  Neck: Neck supple.  Cardiovascular: Regular rhythm.   Respiratory: Breath sounds normal.  GI: Soft. There is no tenderness. There is no guarding.  Musculoskeletal: She exhibits no edema.  Neurological: She is alert and oriented to person, place, and time.  Skin: Skin is warm and dry.  Psychiatric: She has a normal mood and affect.   VE done by CNM  Prenatal labs: ABO, Rh:  O POSITIVE Antibody:  neg Rubella:  immune RPR:   nr HBsAg:   nr HIV:   neg GBS:   done today CBC Latest Ref Rng 10/02/2014 04/15/2013  WBC 4.0 - 10.5 K/uL 16.6(H) 9.0  Hemoglobin 12.0 - 15.0 g/dL 11.3(L) 13.4  Hematocrit 36.0 - 46.0 % 33.4(L) 38.9  Platelets 150 - 400 K/uL 244 283    Assessment/Plan: Prolonged PPROM Frank breech IUP @ 23 3/7 weeks PTL no evidence of chorio on clinical exam after pt voided a large amount of urine Unexplained elevated AFP  P) admit. NICU consult( Dr Algernon Huxley notified). Magnesium sulfate for sz prophylaxis, PPROM antibiotic protocol, GBS cx pending.  Classical C/s for breech when indicated. Clear liquid. Progesterone injection weekly, BMZ. Bed rest. Await results of sonogram . Stress that with C/s then she will forever need a C/S  Kaulder Zahner A 10/02/2014, 9:23 AM

## 2014-10-02 NOTE — Progress Notes (Signed)
Chaplain paged to provide spiritual support and counsel to Ms Messing and her family. Chaplain was part of the comfort team that cared for Ms Gaertner during her time of grief and loss. Grief care and counsel provided.   Chaplain commends the great work done by the comfort team, it truly makes a difference in times of grief and loss to have such a caring presence,  Marialuisa Basara D. Temitayo Covalt, DMin, MDiv Chaplain

## 2014-10-02 NOTE — MAU Note (Signed)
Report given to [facility] Charge received room assignment of 165.

## 2014-10-02 NOTE — MAU Provider Note (Signed)
  History     CSN: [id number]  Arrival date and time: 10/02/14 0044 Provider on unit upon arrival -patient to room @ 0112 In to see patient @ 0145   Chief Complaint  Patient presents with  . Contractions   HPI  Hx LEEP PPROM - previability expectant management per MFM recommendation Two nights in a row for several hours - persistent pain left lower side / cannot get comfortable (feels like constant gas & aches) Tonight unable to sleep or lie on left side Clear fluid leaking intermittently - no odor and no color No contractions, cramping, or backache No fever or chills  Past Medical History  Diagnosis Date  . Asthma     Past Surgical History  Procedure Laterality Date  . Dilation and curettage of uterus  2007  . Leep      History reviewed. No pertinent family history.  Social History  Substance Use Topics  . Smoking status: Never Smoker   . Smokeless tobacco: Never Used  . Alcohol Use: No    Allergies: No Known Allergies  Prescriptions prior to admission  Medication Sig Dispense Refill Last Dose  . albuterol (PROVENTIL HFA;VENTOLIN HFA) 108 (90 BASE) MCG/ACT inhaler Inhale 2 puffs into the lungs every 4 (four) hours as needed for wheezing or shortness of breath.   Past Month at Unknown time  . Prenatal Vit-Fe Fumarate-FA (PRENATAL VITAMIN PO) Take by mouth.   10/01/2014 at Unknown time  . beclomethasone (QVAR) 80 MCG/ACT inhaler Inhale 2 puffs into the lungs daily.   Not Taking    ROS  pain left lower side  increased with FM resolves spontaneously denies menstrual cramping or pelvic pressure  Physical Exam   Blood pressure 128/70, pulse 83, temperature 98.6 F (37 C), resp. rate 18, height  (1.6 m), weight 77.293 kg (170 lb 6.4 oz), last menstrual period 04/21/2014.  Physical Exam Alert and oriented / NAD  Abdomen soft and non-tender Uterus @ umbilicus without tenderness Localized pain left lower side - suspect transverse lie with Leopalds  SSE:  scant clear fluid / no color and no odor Cervix visualized appears closed / long ~ 2cm visible in vagina   MAU Course  Procedures  NST - AGA with FHR 150-160 / some 10x10 accels / occasional variable decel              toco - no UI or ctx detected or palpated by provider at bedside  Informal sono - singleton fetus / transverse lie with fetal head on maternal left - corresponds to area of tenderness                          +cardiac activity seen / FM visible per sono / scant AF small pocket right side of uterus  Assessment and Plan  23.[redacted] weeks gestation Known PPROM since 19 weeks Abnormal elevated AFP  Pain/ discomfort related to transverse fetal lie - no evidence of infection or labor onset Patient reassured  DC home to continue bedrest and expectant management per MFM recommendation with admission at 24 weeks when viability reached  Ho Parisi 10/02/2014, 1:38 AM

## 2014-10-02 NOTE — Progress Notes (Signed)
CTSP due to FHR 180's with deep variables down to the 90's for  40-90 mins  Given the above, recommend to proceed with delivery . Breech presentation necessitate C/S. Reviewed C/S risk: infection, bleeding, injury to surrounding organs, need for C/s in the future, poss blood transfusion. All ? Answered. OR notified

## 2014-10-02 NOTE — Transfer of Care (Signed)
Immediate Anesthesia Transfer of Care Note  Patient: Tiffany Mccarthy  Procedure(s) Performed: Procedure(s): CESAREAN SECTION (N/A)  Patient Location: PACU  Anesthesia Type:Spinal  Level of Consciousness: awake, alert  and oriented  Airway & Oxygen Therapy: Patient Spontanous Breathing  Post-op Assessment: Report given to RN and Post -op Vital signs reviewed and stable  Post vital signs: Reviewed and stable  Last Vitals:  Filed Vitals:   10/02/14 1324  BP:   Pulse: 93  Temp:   Resp:     Complications: No apparent anesthesia complications

## 2014-10-02 NOTE — Progress Notes (Signed)
Pt c/o feeling hot and dizzy. Says, It's hard to breathe. BP low, see flow sheet. Magnesium sulfate off. LR bolus started.

## 2014-10-02 NOTE — Anesthesia Preprocedure Evaluation (Signed)
Anesthesia Evaluation  Patient identified by MRN, date of birth, ID band Patient awake and Patient confused    Reviewed: Allergy & Precautions, H&P , NPO status , Patient's Chart, lab work & pertinent test results  Airway Mallampati: II       Dental   Pulmonary asthma ,  breath sounds clear to auscultation  Pulmonary exam normal       Cardiovascular Exercise Tolerance: Good Normal cardiovascular examRhythm:regular Rate:Normal     Neuro/Psych    GI/Hepatic   Endo/Other    Renal/GU      Musculoskeletal   Abdominal   Peds  Hematology 11/33  plts 244   Anesthesia Other Findings   Reproductive/Obstetrics (+) Pregnancy 23 week, breech                             Anesthesia Physical Anesthesia Plan  ASA: II and emergent  Anesthesia Plan: Spinal and General   Post-op Pain Management:    Induction:   Airway Management Planned:   Additional Equipment:   Intra-op Plan:   Post-operative Plan:   Informed Consent: I have reviewed the patients History and Physical, chart, labs and discussed the procedure including the risks, benefits and alternatives for the proposed anesthesia with the patient or authorized representative who has indicated his/her understanding and acceptance.     Plan Discussed with:   Anesthesia Plan Comments:         Anesthesia Quick Evaluation

## 2014-10-02 NOTE — MAU Note (Signed)
Notified Marlinda Mike CNM familiar with patient, received orders, will be into see patient shortly.

## 2014-10-02 NOTE — Anesthesia Procedure Notes (Signed)
Spinal Patient location during procedure: OR Start time: 10/02/2014 1:32 PM End time: 10/02/2014 1:44 PM Staffing Anesthesiologist: Sebastian Ache Performed by: anesthesiologist  Preanesthetic Checklist Completed: patient identified, site marked, surgical consent, pre-op evaluation, timeout performed, IV checked, risks and benefits discussed and monitors and equipment checked Spinal Block Patient position: sitting Prep: site prepped and draped and DuraPrep Patient monitoring: heart rate, continuous pulse ox and blood pressure Approach: midline Location: L3-4 Injection technique: single-shot Needle Needle type: Pencil-Tip  Needle gauge: 24 G Needle length: 10 cm Needle insertion depth: 6 cm Assessment Sensory level: T4 Additional Notes No complications

## 2014-10-02 NOTE — MAU Provider Note (Signed)
History     CSN: [id number]  Arrival date and time: 10/02/14 0712  Provider her to see patient @ 0800    Chief Complaint  Patient presents with  . Abdominal Cramping   HPI seen earlier in night to evaluate left side more constant discomfort - no pain / no cramps / no bleeding      felt Friday night x 1 lasting 15 minutes then resolved completely until again last night when turning over      evaluation earlier tonight - normal / no uterine tenderness / fluid clear / no cramping or pain / no bleeding      exam more consistent with fetal lie- describe left side pain as feeling like soreness after muscle cramp       pain not persistent and increased with FM     New onset cramping and intense pain every 5-10 minutes since 0600 - saw pink when wiping this am with void Feeling knot on left side when entire abdomen getting tight with menstrual cramps between reports uncomfortable before but this is different and states pain 9/10  Past Medical History  Diagnosis Date  . Asthma     Past Surgical History  Procedure Laterality Date  . Dilation and curettage of uterus  2007  . Leep      No family history on file.  Social History  Substance Use Topics  . Smoking status: Never Smoker   . Smokeless tobacco: Never Used  . Alcohol Use: No    Allergies: No Known Allergies  Prescriptions prior to admission  Medication Sig Dispense Refill Last Dose  . albuterol (PROVENTIL HFA;VENTOLIN HFA) 108 (90 BASE) MCG/ACT inhaler Inhale 2 puffs into the lungs every 4 (four) hours as needed for wheezing or shortness of breath.   Past Month at Unknown time  . beclomethasone (QVAR) 80 MCG/ACT inhaler Inhale 2 puffs into the lungs daily.   Not Taking  . Prenatal Vit-Fe Fumarate-FA (PRENATAL VITAMIN PO) Take by mouth.   10/01/2014 at Unknown time   ROS   ligament pain left side  painful cramps and tightening no pelvic pressure + spotting pink with BRP this am before coming to hospital  Physical  Exam   Blood pressure 122/74, pulse 71, temperature 98.1 F (36.7 C), temperature source Oral, resp. rate 16, last menstrual period 04/21/2014.  Physical Exam alert and oriented / marked change in appearance with acute pain in abdomen abdomen soft / warm to touch and diffusely and mildly tender / uterus mildly tender with palpation just below umbilicus pinkish -red color to peri-pad worn to hospital SSE: turbid fluid with streaks of red          cervix visualized closed with ~ 2cm length in vaginal vault           (+) tenderness with cervical motion with speculum           pooling turbid fluid in speculum - no appreciable odor to fluid / culture obtained from fluid collection  FHR 150-160 / + accels 10x10 / variable decel with each ctx to nadir 80s x 50 seconds  CBC reviewed: WBC 16.6 - previously 14.7 (8/9) - no significant increase  Results for KYESHA, BALLA (MRN [id number]) as of 10/02/2014 08:47  Ref. Range 10/02/2014 07:52  WBC Latest Ref Range: 4.0-10.5 K/uL 16.6 (H)  RBC Latest Ref Range: 3.87-5.11 MIL/uL 3.77 (L)  Hemoglobin Latest Ref Range: 12.0-15.0 g/dL 29.5 (L)  HCT Latest Ref Range: 36.0-46.0 % 33.4 (L)  MCV Latest Ref Range: 78.0-100.0 fL 88.6  MCH Latest Ref Range: 26.0-34.0 pg 30.0  MCHC Latest Ref Range: 30.0-36.0 g/dL 40.9  RDW Latest Ref Range: 11.5-15.5 % 13.5  Platelets Latest Ref Range: 150-400 K/uL 244  Neutrophils Latest Ref Range: 43-77 % 79 (H)  Lymphocytes Latest Ref Range: 12-46 % 14  Monocytes Relative Latest Ref Range: 3-12 % 6  Eosinophil Latest Ref Range: 0-5 % 1  Basophil Latest Ref Range: 0-1 % 0  NEUT# Latest Ref Range: 1.7-7.7 K/uL 13.3 (H)  Lymphocyte # Latest Ref Range: 0.7-4.0 K/uL 2.2  Monocyte # Latest Ref Range: 0.1-1.0 K/uL 0.9  Eosinophils Absolute Latest Ref Range: 0.0-0.7 K/uL 0.2  Basophils Absolute Latest Ref Range: 0.0-0.1 K/uL 0.0    MAU Course  Procedures  bed-side sono - STAT  Assessment and Plan  23.3 weeks PPROM since  19 weeks Suspect developing chorioamnionitis with onset of labor acute change in status/ exam    1) CBC with diff 2) cath urine culture 3) amniotic fluid culture 4) IV LR bolus then 139ml/hr  TC to Dr. Cherly Hensen - updated on status STAT bedside ultrasound - EFW / presentation to determine next course of action Dr Cherly Hensen to follow  Marlinda Mike 10/02/2014, 8:15 AM

## 2014-10-02 NOTE — Progress Notes (Signed)
CTSP due to passing out while receiving Magnesium bolus. Her BP had bottomed out with systolic in the 70's On arrival pt already was arousable. Oriented and answered appropriatelsy. anesthiologist was in the room. BS checked was 112. Pt had vomited x1. Pt received stadol as well Will proceed with 2g/hr magnesium.  Tracing: baseline 170 down to 160  (+)intermittent variable decel ? Ctx q 6-7 mins  IMP: PPROM  On Ampi/Azithromycin. On magnesium. BMZ #1 given Breech IUP@ 23 3/7 weeks P) close fetal monitoring

## 2014-10-02 NOTE — Anesthesia Postprocedure Evaluation (Signed)
  Anesthesia Post-op Note  Patient: [facility]  Procedure(s) Performed: Procedure(s): CESAREAN SECTION (N/A)  Patient Location: PACU  Anesthesia Type:Spinal  Level of Consciousness: alert  and sedated  Airway and Oxygen Therapy: Patient Spontanous Breathing  Post-op Pain: none  Post-op Assessment: Post-op Vital signs reviewed, Patient's Cardiovascular Status Stable, Respiratory Function Stable, Patent Airway and No signs of Nausea or vomiting              Post-op Vital Signs: Reviewed and stable  Last Vitals:  Filed Vitals:   10/02/14 1324  BP:   Pulse: 93  Temp:   Resp:     Complications: No apparent anesthesia complications

## 2014-10-02 NOTE — MAU Note (Signed)
Had PPROM between 19-20wks. Was to be admitted next Friday but is having contraction-like discomfort. Having pain lower abd about every 3-4mins. Crampy, gasy like pain. Cont to leak clear fld

## 2014-10-03 ENCOUNTER — Encounter (HOSPITAL_COMMUNITY): Payer: Self-pay | Admitting: Obstetrics and Gynecology

## 2014-10-03 LAB — CBC
HEMATOCRIT: 28.3 % — AB (ref 36.0–46.0)
HEMOGLOBIN: 9.4 g/dL — AB (ref 12.0–15.0)
MCH: 29.5 pg (ref 26.0–34.0)
MCHC: 33.2 g/dL (ref 30.0–36.0)
MCV: 88.7 fL (ref 78.0–100.0)
Platelets: 238 10*3/uL (ref 150–400)
RBC: 3.19 MIL/uL — ABNORMAL LOW (ref 3.87–5.11)
RDW: 13.4 % (ref 11.5–15.5)
WBC: 21.8 10*3/uL — ABNORMAL HIGH (ref 4.0–10.5)

## 2014-10-03 LAB — RPR: RPR Ser Ql: NONREACTIVE

## 2014-10-03 LAB — HEPATITIS B SURFACE ANTIGEN: Hepatitis B Surface Ag: NEGATIVE

## 2014-10-03 LAB — HIV ANTIBODY (ROUTINE TESTING W REFLEX): HIV Screen 4th Generation wRfx: NONREACTIVE

## 2014-10-03 NOTE — Op Note (Deleted)
NAME:  Tiffany Mccarthy, Tiffany Mccarthy              ACCOUNT NO.:  [id number]  MEDICAL RECORD NO.:  [id number]  LOCATION:  [facility]                         [facility]  PHYSICIAN:  Maxie Better, M.D.DATE OF BIRTH:  [birth year]  DATE OF PROCEDURE:  10/02/2014 DATE OF DISCHARGE:  10/02/2014                              OPERATIVE REPORT   PREOPERATIVE DIAGNOSIS:  Nonreassuring fetal tracing.  A prolonged preterm premature rupture of membranes.  Intrauterine gestation at 32 and 3/7 weeks.  Breech presentation.  PROCEDURE:  Primary classical cesarean section.  POSTOPERATIVE DIAGNOSIS:  Breech presentation.  Nonreassuring fetal tracing.  Prolonged premature preterm rupture of membranes. Intrauterine gestation of 70 and 3/7 weeks.  ANESTHESIA:  Spinal.  SURGEON:  Maxie Better, M.D.  ASSISTANT:  Darryl Nestle, MD  DESCRIPTION OF PROCEDURE:  Under adequate spinal anesthesia, the patient was placed in the supine position with a left lateral tilt.  She was sterilely prepped and draped in usual fashion.  An indwelling Foley catheter was sterilely placed.  A 0.25% Marcaine was injected along the planned Pfannenstiel skin incision site.  Pfannenstiel skin incision was then made, carried down to the rectus muscle.  Rectus muscle was split in midline.  The parietal peritoneum was entered bluntly and extended. A self-retaining Alexis retractor was then placed.  A small uterus was encountered.  A vertical uterine incision was made, carried down to the cavity, at which point, the anterior placenta was encountered, which was ratty.  Palpation of the cavity revealed the arm that was then pushed back into the uterine cavity.  The leg was attempted to be reached, for the baby was supposed to be in a footling position.  Ultimately, the baby was delivered displacing the head and bringing it out to the field. Cord was quickly clamped, baby was transferred to the pediatricians, who ultimately assigned  Apgars of 0, 0 and 0, despite attempts.  The placenta was manually removed.  Uterine cavity was cleaned of debris. The uterine incision was closed in several layers.  The deeper layer was 0 Monocryl running stitch to the field of the serosal surface was reached, at which time 3-0 Monocryl suture was placed approximately that a subserosal surface.  A 2-0 Monocryl suture, figure-of-eight stitch was placed on the fundal region of the incisions for hemostasis purposes. Normal tubes and ovaries were noted bilaterally.  The abdomen was irrigated and suctioned of debris.  Interceed was placed overlying the uterine incision.  The parietal peritoneum was closed with 2-0 Vicryl. The rectus fascia was closed with 0 Vicryl x2.  The subcutaneous area was irrigated, small bleeders cauterized.  Interrupted 2-0 plain sutures placed and the skin approximated with Ethicon staples.  SPECIMEN:  Placenta sent to Pathology.  ESTIMATED BLOOD LOSS:  600 mL.  INTRAOPERATIVE FLUID:  3 L.  URINE OUTPUT:  100 mL.  COUNTS:  Sponge and instrument counts x2 was correct.  COMPLICATION:  None.  The patient tolerated the procedure well, was transferred to recovery room in stable condition.     Maxie Better, M.D.     [place]/MEDQ  D:  10/02/2014  T:  10/02/2014  Job:  [id number]

## 2014-10-03 NOTE — Progress Notes (Addendum)
POD # 1  Subjective: Pt reports feeling ok/ Pain controlled with Motrin and Percocet /requesting abd binder Tolerating po/ Foley d/c'd and voiding without problems/ No n/v/ Flatus present Activity: ad lib Bleeding is light Newborn info:  Information for the patient's newborn:  Sequita, Wise [[id number]]  female Neonatal demise   Objective:  VS:  Filed Vitals:   10/02/14 2359 10/03/14 0204 10/03/14 0536 10/03/14 1023  BP: 118/65 114/44 106/56 111/55  Pulse: 89 62 56 58  Temp: 99.1 F (37.3 C) 98.5 F (36.9 C) 98.5 F (36.9 C) 98.9 F (37.2 C)  TempSrc: Oral Oral Oral Oral  Resp: Height:      Weight:      SpO2: 100% 100% 100% 100%     I&O: Intake/Output      08/21 0701 - 08/22 0700 08/22 0701 - 08/23 0700   P.O. 3460 240   I.V. (mL/kg) 4645.2 (60.1)    IV Piggyback 300    Total Intake(mL/kg) 8405.2 (108.7) 240 (3.1)   Urine (mL/kg/hr) 5995 700 (1.6)   Emesis/NG output 300    Blood 450    Total Output 6745 700   Net +1660.2 -460           Recent Labs  10/02/14 0752 10/03/14 0500  WBC 16.6* 21.8*  HGB 11.3* 9.4*  HCT 33.4* 28.3*  PLT 244 238    Blood type: --/--/O POS (08/21 0752) Rubella:      Physical Exam:  General: alert, cooperative and no distress CV: Regular rate and rhythm Resp: CTA bilaterally Abdomen: soft, nontender, normal bowel sounds Incision: Covered with Tegaderm and honeycomb dressing; no significant drainage, edema, bruising, or erythema; well approximated with staples Uterine Fundus: firm, below umbilicus, nontender Lochia: minimal Ext: extremities normal, atraumatic, no cyanosis or edema and Homans sign is negative, no sign of DVT    Assessment: POD # 1/ G1P0101/ S/P C/Section d/t breech and PPROM, and non-reasurring FHT  ABL anemia Doing well  Plan: Chaplain consult complete Ambulate Continue routine post op orders Consider discharge home tomorrow   Signed: Lawernce Pitts, MSN, CNM 10/03/2014,  12:42 PM

## 2014-10-03 NOTE — Progress Notes (Signed)
Baby taken to morgue. Parents have chosen a burial at [facility] for infant

## 2014-10-03 NOTE — Op Note (Addendum)
Tiffany Mccarthy, Tiffany Mccarthy              ACCOUNT NO.:  [id number]  MEDICAL RECORD NO.:  [id number]  LOCATION:  9315                          [facility]ILITY:  [facility]  PHYSICIAN:  Maxie Better, M.D.DATE OF BIRTH:  August 09, 1985  DATE OF PROCEDURE:  10/02/2014 DATE OF DISCHARGE:                              OPERATIVE REPORT   PREOPERATIVE DIAGNOSIS:  Nonreassuring fetal tracing.   Prolonged preterm premature rupture of membranes.  Intrauterine gestation at 77 and 3/7 weeks.  Breech presentation.  PROCEDURE:  Primary classical cesarean section.  POSTOPERATIVE DIAGNOSIS:  Breech presentation.  Nonreassuring fetal tracing.  Prolonged premature preterm rupture of membranes. Intrauterine gestation of 68 and 3/7 weeks.  ANESTHESIA:  Spinal.  SURGEON:  Maxie Better, M.D.  ASSISTANT:  Darryl Nestle, MD  DESCRIPTION OF PROCEDURE:  Under adequate spinal anesthesia, the patient was placed in the supine position with a left lateral tilt.  She was sterilely prepped and draped in usual fashion.  An indwelling Foley catheter was sterilely placed.  A 0.25% Marcaine was injected along the planned Pfannenstiel skin incision site.  Pfannenstiel skin incision was then made, carried down to the rectus muscle.  Rectus muscle was split in midline.  The parietal peritoneum was entered bluntly and extended. A self-retaining Alexis retractor was then placed.  A small uterus was encountered.  A vertical uterine incision was made, carried down to the cavity, at which point, the anterior placenta was encountered, which was ratty.  Palpation of the cavity revealed the arm that was then pushed back into the uterine cavity.  The leg was attempted to be reached, for the baby was supposed to be in a breech position.  Ultimately, the baby was delivered displacing the head and bringing it out to the field. Cord was quickly clamped, baby was transferred to the pediatricians, who ultimately assigned Apgars of 1,1,1 but  passed despite resuscitative attempts.  The placenta was manually removed.  Uterine cavity was cleaned of debris. The uterine incision was closed in several layers.  The deeper layer was 0 Monocryl running stitch until the serosal surface was reached, at which time 3-0 Monocryl suture was used in a baseball fashion.Marland Kitchen  A 2-0 Monocryl suture, figure-of-eight stitch was placed on the fundal region of the incisions for hemostasis purposes. Normal tubes and ovaries were noted bilaterally.  The abdomen was irrigated and suctioned of debris.  Interceed was placed overlying the uterine incision.  The parietal peritoneum was closed with 2-0 Vicryl. The rectus fascia was closed with 0 Vicryl x2.  The subcutaneous area was irrigated, small bleeders cauterized.  Interrupted 2-0 plain sutures placed and the skin approximated with Ethicon staples.  SPECIMEN:  Placenta sent to Pathology.  ESTIMATED BLOOD LOSS:  600 mL.  INTRAOPERATIVE FLUID:  3 L.  URINE OUTPUT:  100 mL.  COUNTS:  Sponge and instrument counts x2 was correct.  COMPLICATION:  None.  The patient tolerated the procedure well, was transferred to recovery room in stable condition. Baby passed away before completion of surgery.     Maxie Better, M.D.     [place]/MEDQ  D:  10/02/2014  T:  10/02/2014  Job:  [id number]

## 2014-10-04 DIAGNOSIS — D62 Acute posthemorrhagic anemia: Secondary | ICD-10-CM | POA: Diagnosis not present

## 2014-10-04 LAB — URINE CULTURE: Special Requests: NORMAL

## 2014-10-04 MED ORDER — FERROUS SULFATE 325 (65 FE) MG PO TABS: 325.0000 mg | ORAL_TABLET | Freq: Every day | ORAL | Status: AC

## 2014-10-04 MED ORDER — OXYCODONE-ACETAMINOPHEN 5-325 MG PO TABS: 1.0000 | ORAL_TABLET | ORAL | Status: AC | PRN

## 2014-10-04 MED ORDER — IBUPROFEN 600 MG PO TABS: 600.0000 mg | ORAL_TABLET | Freq: Four times a day (QID) | ORAL | Status: AC

## 2014-10-04 NOTE — Progress Notes (Signed)
I spent time with Tiffany Mccarthy and Tiffany Mccarthy as they continued to process the loss of their baby, Tiffany Mccarthy.  Tiffany Mccarthy was able to express his emotions, and particularly, the helplessness that he felt at not being able to protect his son and being the one who had to make the decision to not continue resuscitation.  I helped him work to reframe the way that he protected his son from pain and the way that he made a decision for his son out of love.  In our conversation, Tiffany Mccarthy and Tiffany Mccarthy were able to share their experiences and hear each other well.    They are both incredibly appreciative of the care they received here at [facility] and are very much interested in finding out how to help other families who may experience this later.  They have my card for follow-up bereavement support as well as information about Heart[facility].  They are aware of our ongoing availability for them even after discharge.  [address] Dyanne Carrel, Bcc Pager, [phone] 11:33 AM    10/04/14 1100  Clinical Encounter Type  Visited With Patient and family together  Visit Type Spiritual support;Death  Referral From Nurse;Chaplain  Consult/Referral To Energy manager, Comfort Team Support)  Spiritual Encounters  Spiritual Needs Emotional;Grief support  Stress Factors  Patient Stress Factors Loss  Family Stress Factors Loss

## 2014-10-04 NOTE — Discharge Summary (Signed)
DISCHARGE SUMMARY:  Patient ID: Tiffany Mccarthy MRN: [id number] DOB/AGE: 1985-10-09 29 y.o.  Admit date: 10/02/2014 Admission Diagnoses: PPROM , PTL @ 23 3/7 weeks, breech presentation   Discharge date: 10/04/2014 Discharge Diagnoses: breech delivery S/P  Classical C/S on 10/02/14, ABL anemia        Prenatal history: G1P0101   EDC: 01/26/2015, by Last Menstrual Period  [facility]atal care at [facility] since [redacted] wks gestation. Primary provider: Dr. Cherly Hensen Prenatal course complicated by hx LEEP, abnml AFP-normal Informaseq, ?club feet, PPROM at 19 wks  Prenatal labs: ABO, Rh: --/--/O POS (08/21 7829)  Antibody:  Neg Rubella:   Immune RPR: Non Reactive (08/21 0752)  HBsAg: Negative (08/21 1910)  HIV:   Neg GBS:   unknown GTT: n/a  Medical / Surgical History :  Past medical history:  Past Medical History  Diagnosis Date  . Asthma     Past surgical history:  Past Surgical History  Procedure Laterality Date  . Dilation and curettage of uterus  2007  . Leep    . Cesarean section N/A 10/02/2014    Procedure: CESAREAN SECTION;  Surgeon: Maxie Better, MD;  Location: [facility];  Service: Obstetrics;  Laterality: N/A;     Medications on Admission: Prescriptions prior to admission  Medication Sig Dispense Refill Last Dose  . albuterol (PROVENTIL HFA;VENTOLIN HFA) 108 (90 BASE) MCG/ACT inhaler Inhale 2 puffs into the lungs every 4 (four) hours as needed for wheezing or shortness of breath.   Past Month at Unknown time  . Prenatal Vit-Fe Fumarate-FA (PRENATAL VITAMIN PO) Take 1 tablet by mouth.    10/01/2014 at Unknown time  . beclomethasone (QVAR) 80 MCG/ACT inhaler Inhale 2 puffs into the lungs daily.   prn    Allergies: Review of patient's allergies indicates no known allergies.   Intrapartum Course:  Admitted for PPROM and PTL, MgSO4 and PPROM abx started, fetal tachycardia and deep variables developed, to OR for stat PCS due to breech presentation and  patients desires to proceed with all measures. NICU consult obtained. Sonogram done for estimated fetal weight. [place] counseling provided  Postpartum Course: Complicated by ABL anemia.  Physical Exam:   VSS: Blood pressure 109/67, pulse 62, temperature 99.3 F (37.4 C), temperature source Oral, resp. rate 14, height  (1.6 m), weight 77.293 kg (170 lb 6.4 oz), last menstrual period 04/21/2014, SpO2 100 %, unknown if currently breastfeeding.  LABS:  Recent Labs  10/02/14 0752 10/03/14 0500  WBC 16.6* 21.8*  HGB 11.3* 9.4*  PLT 244 238    General: alert and oriented x3 Heart: RRR Lungs: CTA bilaterally GI: soft, non-tender, non-distended, BS x4 Lochia: small Uterus: firm below umbilicus Incision: well approximated with staples; honeycomb dressing-no significant erythema, drainage, or edema Extremities: No edema, Homans neg   Newborn Data Live born female  Birth Weight: 1 lb 3.1 oz (540 g) APGAR: 1, 1  See operative report for further details  Neonatal demise  Discharge Instructions:  Wound Care: keep clean and dry Postpartum Instructions: [facility] discharge booklet - instructions reviewed Medications:    Medication List    TAKE these medications        albuterol 108 (90 BASE) MCG/ACT inhaler  Commonly known as:  PROVENTIL HFA;VENTOLIN HFA  Inhale 2 puffs into the lungs every 4 (four) hours as needed for wheezing or shortness of breath.     beclomethasone 80 MCG/ACT inhaler  Commonly known as:  QVAR  Inhale 2 puffs into the lungs daily.  ferrous sulfate 325 (65 FE) MG tablet  Take 1 tablet (325 mg total) by mouth daily with breakfast.     ibuprofen 600 MG tablet  Commonly known as:  ADVIL,MOTRIN  Take 1 tablet (600 mg total) by mouth every 6 (six) hours.     oxyCODONE-acetaminophen 5-325 MG per tablet  Commonly known as:  PERCOCET/ROXICET  Take 1-2 tablets by mouth every 4 (four) hours as needed (for pain scale 4-7).     PRENATAL VITAMIN PO  Take  1 tablet by mouth.            Follow-up Information    Follow up with Fayetta Sorenson A, MD In 3 days.   Specialty:  Obstetrics and Gynecology   Why:  staple removal with RN   Contact information:   [address] [place] [address] [phone]       Follow up with Orissa Arreaga A, MD. Schedule an appointment as soon as possible for a visit in 6 weeks.   Specialty:  Obstetrics and Gynecology   Contact information:   [address] [place] [address] [phone]         Signed: Donette Larry, Dorris Carnes MSN, CNM 10/04/2014, 11:23 AM

## 2014-10-04 NOTE — Progress Notes (Signed)
Patient discharged home with significant other... Discharge instructions reviewed with patient and she verbalized understanding... Condition stable... No equipment... Ambulated to car with E. Timoty Bourke, RN.  

## 2014-10-04 NOTE — Progress Notes (Signed)
POD # 2  Subjective: Pt reports feeling well, desires d/c today/ Pain controlled with Motrin Tolerating po/Voiding without problems/ No n/v/ Flatus present Activity: ad lib Bleeding is light Newborn info:  Information for the patient's newborn:  Carlotta, Telfair [[id number]]  female Neonatal demise   Objective: VS: VS:  Filed Vitals:   10/03/14 1734 10/03/14 2110 10/04/14 0617 10/04/14 0643  BP: 123/74 138/67 109/67   Pulse: 65 69 62   Temp: 99 F (37.2 C) 98.8 F (37.1 C) 99.3 F (37.4 C)   TempSrc: Oral Oral Oral   Resp: Height:      Weight:      SpO2: 100% 100% 97% 100%    I&O: Intake/Output      08/22 0701 - 08/23 0700 08/23 0701 - 08/24 0700   P.O. 240    I.V. (mL/kg)     IV Piggyback     Total Intake(mL/kg) 240 (3.1)    Urine (mL/kg/hr) 1550 (0.8)    Emesis/NG output     Blood     Total Output 1550     Net -1310          Urine Occurrence 2 x      LABS:  Recent Labs  10/02/14 0752 10/03/14 0500  WBC 16.6* 21.8*  HGB 11.3* 9.4*  PLT 244 238   Blood type: --/--/O POS (08/21 0752) Rubella:           Immune  Physical Exam:  General: alert, cooperative and no distress CV: Regular rate and rhythm Resp: CTA bilaterally Abdomen: soft, nontender, normal bowel sounds Incision: Covered with Tegaderm and honeycomb dressing; no significant drainage, edema, bruising, or erythema; well approximated with staples Uterine Fundus: firm, below umbilicus, nontender Lochia: minimal Ext: extremities normal, atraumatic, no cyanosis or edema and Homans sign is negative, no sign of DVT    Assessment: POD # 2/ G1P0101/ S/P C/Section d/t NRFHT  ABL anemia Doing well and stable for discharge home  Plan: Discharge home RX's: Ibuprofen  po Q 6 hrs prn pain #30 Refill x 1 Percocet 5/325 1 - 2 tabs po every 4 hrs prn pain #30 Refill x 0 Niferex  po QD #30 Refill x 1 Follow up in 3 days for staple removal at [facility] Follow up in 6 wks for postpartum  check at [facility] booklet given    Signed: Donette Larry, Dorris Carnes, MSN, CNM 10/04/2014, 11:17 AM

## 2014-10-05 LAB — RUBELLA SCREEN: Rubella: 1.7 index (ref >0.99)

## 2014-10-05 LAB — BODY FLUID CULTURE
Culture: NORMAL
Special Requests: NORMAL

## 2014-10-05 LAB — CULTURE, GROUP A STREP

## 2014-10-07 ENCOUNTER — Ambulatory Visit (HOSPITAL_COMMUNITY): Admission: RE | Admit: 2014-10-07 | Payer: No Typology Code available for payment source | Source: Ambulatory Visit

## 2014-10-07 ENCOUNTER — Ambulatory Visit (HOSPITAL_COMMUNITY): Payer: No Typology Code available for payment source

## 2014-10-10 ENCOUNTER — Ambulatory Visit (HOSPITAL_COMMUNITY): Payer: No Typology Code available for payment source

## 2014-11-25 ENCOUNTER — Other Ambulatory Visit: Payer: Self-pay | Admitting: Obstetrics and Gynecology
